# Patient Record
Sex: Male | Born: 1980 | Race: Black or African American | Hispanic: No | Marital: Single | Smoking: Never smoker
Health system: Southern US, Community
[De-identification: ages and names within clinical notes are randomized; demographics above are authoritative.]

## PROBLEM LIST (undated history)

## (undated) DIAGNOSIS — N200 Calculus of kidney: Secondary | ICD-10-CM

## (undated) HISTORY — PX: LITHOTRIPSY: SUR834

---

## 2000-10-22 ENCOUNTER — Emergency Department (HOSPITAL_COMMUNITY): Admission: EM | Admit: 2000-10-22 | Discharge: 2000-10-22 | Payer: Self-pay | Admitting: Emergency Medicine

## 2000-12-21 ENCOUNTER — Emergency Department (HOSPITAL_COMMUNITY): Admission: EM | Admit: 2000-12-21 | Discharge: 2000-12-21 | Payer: Self-pay | Admitting: Emergency Medicine

## 2001-01-17 ENCOUNTER — Emergency Department (HOSPITAL_COMMUNITY): Admission: EM | Admit: 2001-01-17 | Discharge: 2001-01-17 | Payer: Self-pay | Admitting: Internal Medicine

## 2002-12-03 ENCOUNTER — Emergency Department (HOSPITAL_COMMUNITY): Admission: EM | Admit: 2002-12-03 | Discharge: 2002-12-03 | Payer: Self-pay | Admitting: Emergency Medicine

## 2002-12-03 ENCOUNTER — Encounter: Payer: Self-pay | Admitting: Emergency Medicine

## 2003-05-08 ENCOUNTER — Emergency Department (HOSPITAL_COMMUNITY): Admission: EM | Admit: 2003-05-08 | Discharge: 2003-05-08 | Payer: Self-pay | Admitting: Emergency Medicine

## 2004-05-13 ENCOUNTER — Emergency Department (HOSPITAL_COMMUNITY): Admission: EM | Admit: 2004-05-13 | Discharge: 2004-05-13 | Payer: Self-pay | Admitting: Emergency Medicine

## 2006-01-22 ENCOUNTER — Emergency Department (HOSPITAL_COMMUNITY): Admission: EM | Admit: 2006-01-22 | Discharge: 2006-01-22 | Payer: Self-pay | Admitting: Family Medicine

## 2007-01-13 ENCOUNTER — Emergency Department (HOSPITAL_COMMUNITY): Admission: EM | Admit: 2007-01-13 | Discharge: 2007-01-13 | Payer: Self-pay | Admitting: Family Medicine

## 2008-09-29 ENCOUNTER — Emergency Department (HOSPITAL_COMMUNITY): Admission: EM | Admit: 2008-09-29 | Discharge: 2008-09-29 | Payer: Self-pay | Admitting: Emergency Medicine

## 2011-03-20 ENCOUNTER — Emergency Department (HOSPITAL_COMMUNITY)
Admission: EM | Admit: 2011-03-20 | Discharge: 2011-03-20 | Disposition: A | Discharge status: Home / Self Care | Payer: Medicaid Other | Attending: Emergency Medicine | Admitting: Emergency Medicine

## 2011-03-20 DIAGNOSIS — K089 Disorder of teeth and supporting structures, unspecified: Secondary | ICD-10-CM | POA: Insufficient documentation

## 2013-04-22 ENCOUNTER — Emergency Department: Payer: Self-pay | Admitting: Emergency Medicine

## 2013-04-22 LAB — CBC
HCT: 45.2 % (ref 40.0–52.0)
HGB: 15.7 g/dL (ref 13.0–18.0)
MCH: 31.2 pg (ref 26.0–34.0)
MCHC: 34.8 g/dL (ref 32.0–36.0)
MCV: 90 fL (ref 80–100)
Platelet: 168 10*3/uL (ref 150–440)
RBC: 5.03 10*6/uL (ref 4.40–5.90)
RDW: 13.7 % (ref 11.5–14.5)
WBC: 9.2 10*3/uL (ref 3.8–10.6)

## 2013-04-22 LAB — COMPREHENSIVE METABOLIC PANEL
Albumin: 4.1 g/dL (ref 3.4–5.0)
Alkaline Phosphatase: 64 U/L (ref 50–136)
Anion Gap: 3 — ABNORMAL LOW (ref 7–16)
BUN: 13 mg/dL (ref 7–18)
Bilirubin,Total: 0.4 mg/dL (ref 0.2–1.0)
Calcium, Total: 9.1 mg/dL (ref 8.5–10.1)
Chloride: 106 mmol/L (ref 98–107)
Co2: 29 mmol/L (ref 21–32)
Creatinine: 0.99 mg/dL (ref 0.60–1.30)
EGFR (African American): >60
EGFR (Non-African Amer.): >60
Glucose: 93 mg/dL (ref 65–99)
Osmolality: 275 (ref 275–301)
Potassium: 4 mmol/L (ref 3.5–5.1)
SGOT(AST): 26 U/L (ref 15–37)
SGPT (ALT): 43 U/L (ref 12–78)
Sodium: 138 mmol/L (ref 136–145)
Total Protein: 7 g/dL (ref 6.4–8.2)

## 2013-04-22 LAB — URINALYSIS, COMPLETE
Bacteria: NONE SEEN
Bilirubin,UR: NEGATIVE
Blood: NEGATIVE
Glucose,UR: NEGATIVE mg/dL (ref 0–75)
Ketone: NEGATIVE
Leukocyte Esterase: NEGATIVE
Nitrite: NEGATIVE
Ph: 8 (ref 4.5–8.0)
Protein: NEGATIVE
RBC,UR: NONE SEEN /HPF (ref 0–5)
Specific Gravity: 1.01 (ref 1.003–1.030)
Squamous Epithelial: NONE SEEN
WBC UR: NONE SEEN /HPF (ref 0–5)

## 2014-05-11 ENCOUNTER — Emergency Department: Payer: Self-pay | Admitting: Emergency Medicine

## 2015-12-11 ENCOUNTER — Emergency Department: Payer: Self-pay

## 2015-12-11 ENCOUNTER — Emergency Department
Admission: EM | Admit: 2015-12-11 | Discharge: 2015-12-11 | Disposition: A | Discharge status: Home / Self Care | Payer: Self-pay | Attending: Emergency Medicine | Admitting: Emergency Medicine

## 2015-12-11 ENCOUNTER — Encounter: Payer: Self-pay | Admitting: Emergency Medicine

## 2015-12-11 DIAGNOSIS — N23 Unspecified renal colic: Secondary | ICD-10-CM | POA: Insufficient documentation

## 2015-12-11 HISTORY — DX: Calculus of kidney: N20.0

## 2015-12-11 LAB — COMPREHENSIVE METABOLIC PANEL
ALK PHOS: 39 U/L (ref 38–126)
ALT: 41 U/L (ref 17–63)
ANION GAP: 6 (ref 5–15)
AST: 40 U/L (ref 15–41)
Albumin: 4.7 g/dL (ref 3.5–5.0)
BILIRUBIN TOTAL: 1.3 mg/dL — AB (ref 0.3–1.2)
BUN: 13 mg/dL (ref 6–20)
CALCIUM: 9 mg/dL (ref 8.9–10.3)
CO2: 22 mmol/L (ref 22–32)
Chloride: 109 mmol/L (ref 101–111)
Creatinine, Ser: 0.97 mg/dL (ref 0.61–1.24)
GFR calc non Af Amer: >60 mL/min (ref >60)
Glucose, Bld: 125 mg/dL — ABNORMAL HIGH (ref 65–99)
Potassium: 3.7 mmol/L (ref 3.5–5.1)
Sodium: 137 mmol/L (ref 135–145)
TOTAL PROTEIN: 7 g/dL (ref 6.5–8.1)

## 2015-12-11 LAB — CBC WITH DIFFERENTIAL/PLATELET
Basophils Absolute: 0.1 10*3/uL (ref 0–0.1)
Basophils Relative: 1 %
Eosinophils Absolute: 0.3 10*3/uL (ref 0–0.7)
Eosinophils Relative: 3 %
HEMATOCRIT: 46.3 % (ref 40.0–52.0)
HEMOGLOBIN: 15.5 g/dL (ref 13.0–18.0)
LYMPHS ABS: 2 10*3/uL (ref 1.0–3.6)
Lymphocytes Relative: 23 %
MCH: 29.9 pg (ref 26.0–34.0)
MCHC: 33.4 g/dL (ref 32.0–36.0)
MCV: 89.5 fL (ref 80.0–100.0)
MONOS PCT: 9 %
Monocytes Absolute: 0.8 10*3/uL (ref 0.2–1.0)
NEUTROS ABS: 5.7 10*3/uL (ref 1.4–6.5)
Neutrophils Relative %: 64 %
Platelets: 203 10*3/uL (ref 150–440)
RBC: 5.17 MIL/uL (ref 4.40–5.90)
RDW: 14 % (ref 11.5–14.5)
WBC: 8.9 10*3/uL (ref 3.8–10.6)

## 2015-12-11 LAB — LIPASE, BLOOD: Lipase: 36 U/L (ref 11–51)

## 2015-12-11 MED ORDER — TAMSULOSIN HCL 0.4 MG PO CAPS: 0.4000 mg | ORAL_CAPSULE | Freq: Every day | ORAL | Status: AC

## 2015-12-11 MED ORDER — HYDROMORPHONE HCL 1 MG/ML IJ SOLN
INTRAMUSCULAR | Status: AC
  Administered 2015-12-11: 1 mg
  Filled 2015-12-11: qty 1

## 2015-12-11 MED ORDER — KETOROLAC TROMETHAMINE 30 MG/ML IJ SOLN
INTRAMUSCULAR | Status: AC
  Filled 2015-12-11: qty 1

## 2015-12-11 MED ORDER — KETOROLAC TROMETHAMINE 30 MG/ML IJ SOLN
30.0000 mg | Freq: Once | INTRAMUSCULAR | Status: AC
  Administered 2015-12-11: 30 mg via INTRAVENOUS

## 2015-12-11 MED ORDER — SODIUM CHLORIDE 0.9 % IV SOLN
1000.0000 mL | Freq: Once | INTRAVENOUS | Status: AC
  Administered 2015-12-11: 1000 mL via INTRAVENOUS

## 2015-12-11 MED ORDER — ONDANSETRON HCL 4 MG/2ML IJ SOLN
INTRAMUSCULAR | Status: AC
  Filled 2015-12-11: qty 2

## 2015-12-11 MED ORDER — ONDANSETRON HCL 4 MG/2ML IJ SOLN
4.0000 mg | Freq: Once | INTRAMUSCULAR | Status: AC
  Administered 2015-12-11: 4 mg via INTRAVENOUS

## 2015-12-11 MED ORDER — HYDROMORPHONE HCL 1 MG/ML IJ SOLN
1.0000 mg | Freq: Once | INTRAMUSCULAR | Status: AC
  Administered 2015-12-11: 1 mg via INTRAVENOUS

## 2015-12-11 MED ORDER — OXYCODONE-ACETAMINOPHEN 7.5-325 MG PO TABS: 1.0000 | ORAL_TABLET | ORAL | Status: AC | PRN

## 2015-12-11 MED ORDER — HYDROMORPHONE HCL 1 MG/ML IJ SOLN
INTRAMUSCULAR | Status: AC
  Filled 2015-12-11: qty 1

## 2015-12-11 MED ORDER — ONDANSETRON HCL 4 MG PO TABS: 4.0000 mg | ORAL_TABLET | Freq: Every day | ORAL | Status: AC | PRN

## 2015-12-11 NOTE — ED Notes (Signed)
Pt woke up with LLQ abd pain this am, denies N,V,D and flank pain, has a hx of kidney stones.

## 2015-12-11 NOTE — Discharge Instructions (Signed)
Kidney Stones °Kidney stones (urolithiasis) are deposits that form inside your kidneys. The intense pain is caused by the stone moving through the urinary tract. When the stone moves, the ureter goes into spasm around the stone. The stone is usually passed in the urine.  °CAUSES  °· A disorder that makes certain neck glands produce too much parathyroid hormone (primary hyperparathyroidism). °· A buildup of uric acid crystals, similar to gout in your joints. °· Narrowing (stricture) of the ureter. °· A kidney obstruction present at birth (congenital obstruction). °· Previous surgery on the kidney or ureters. °· Numerous kidney infections. °SYMPTOMS  °· Feeling sick to your stomach (nauseous). °· Throwing up (vomiting). °· Blood in the urine (hematuria). °· Pain that usually spreads (radiates) to the groin. °· Frequency or urgency of urination. °DIAGNOSIS  °· Taking a history and physical exam. °· Blood or urine tests. °· CT scan. °· Occasionally, an examination of the inside of the urinary bladder (cystoscopy) is performed. °TREATMENT  °· Observation. °· Increasing your fluid intake. °· Extracorporeal shock wave lithotripsy--This is a noninvasive procedure that uses shock waves to break up kidney stones. °· Surgery may be needed if you have severe pain or persistent obstruction. There are various surgical procedures. Most of the procedures are performed with the use of small instruments. Only small incisions are needed to accommodate these instruments, so recovery time is minimized. °The size, location, and chemical composition are all important variables that will determine the proper choice of action for you. Talk to your health care provider to better understand your situation so that you will minimize the risk of injury to yourself and your kidney.  °HOME CARE INSTRUCTIONS  °· Drink enough water and fluids to keep your urine clear or pale yellow. This will help you to pass the stone or stone fragments. °· Strain  all urine through the provided strainer. Keep all particulate matter and stones for your health care provider to see. The stone causing the pain may be as small as a grain of salt. It is very important to use the strainer each and every time you pass your urine. The collection of your stone will allow your health care provider to analyze it and verify that a stone has actually passed. The stone analysis will often identify what you can do to reduce the incidence of recurrences. °· Only take over-the-counter or prescription medicines for pain, discomfort, or fever as directed by your health care provider. °· Keep all follow-up visits as told by your health care provider. This is important. °· Get follow-up X-rays if required. The absence of pain does not always mean that the stone has passed. It may have only stopped moving. If the urine remains completely obstructed, it can cause loss of kidney function or even complete destruction of the kidney. It is your responsibility to make sure X-rays and follow-ups are completed. Ultrasounds of the kidney can show blockages and the status of the kidney. Ultrasounds are not associated with any radiation and can be performed easily in a matter of minutes. °· Make changes to your daily diet as told by your health care provider. You may be told to: °¨ Limit the amount of salt that you eat. °¨ Eat 5 or more servings of fruits and vegetables each day. °¨ Limit the amount of meat, poultry, fish, and eggs that you eat. °· Collect a 24-hour urine sample as told by your health care provider. You may need to collect another urine sample every 6-12   months. °SEEK MEDICAL CARE IF: °· You experience pain that is progressive and unresponsive to any pain medicine you have been prescribed. °SEEK IMMEDIATE MEDICAL CARE IF:  °· Pain cannot be controlled with the prescribed medicine. °· You have a fever or shaking chills. °· The severity or intensity of pain increases over 18 hours and is not  relieved by pain medicine. °· You develop a new onset of abdominal pain. °· You feel faint or pass out. °· You are unable to urinate. °  °This information is not intended to replace advice given to you by your health care provider. Make sure you discuss any questions you have with your health care provider. °  °Document Released: 09/01/2005 Document Revised: 05/23/2015 Document Reviewed: 02/02/2013 °Elsevier Interactive Patient Education ©2016 [employer] Inc. ° °

## 2015-12-11 NOTE — ED Notes (Signed)
Pt  Noted to have low heart rate per monitor, 40bpm , edp at bedside and aware. Pt denies any pain at this time and is asymptomatic to low rate. No acute distress noted.

## 2015-12-11 NOTE — ED Notes (Signed)
Pt trying to find transportation home. Pt uneasy about driving with this Clinical research associatewriter in agreement. edp aware.

## 2015-12-11 NOTE — ED Provider Notes (Signed)
[facility] Emergency Department Provider Note     Time seen: ----------------------------------------- 7:42 AM on 12/11/2015 -----------------------------------------    I have reviewed the triage vital signs and the nursing notes.   HISTORY  Chief Complaint Abdominal Pain    HPI Jose Arnold is a 35 y.o. male who woke up this morning with left lower quadrant pain. Patient describes a sharp, denies nausea vomiting or diarrhea but has broken out into sweats. Reports he had a history of kidney stones in the past but this feels worse. Nothing has helped his symptoms. Currently is 10 out of 10.   Past Medical History  Diagnosis Date  . Kidney stones     There are no active problems to display for this patient.   History reviewed. No pertinent past surgical history.  Allergies Review of patient's allergies indicates no known allergies.  Social History Social History  Substance Use Topics  . Smoking status: Never Smoker   . Smokeless tobacco: None  . Alcohol Use: No    Review of Systems Constitutional: Negative for fever. Eyes: Negative for visual changes. ENT: Negative for sore throat. Cardiovascular: Negative for chest pain. Respiratory: Negative for shortness of breath. Gastrointestinal: Positive for abdominal pain Genitourinary: Negative for dysuria. Musculoskeletal: Negative for back pain. Skin: Negative for rash. Neurological: Negative for headaches, focal weakness or numbness.  10-point ROS otherwise negative.  ____________________________________________   PHYSICAL EXAM:  VITAL SIGNS: ED Triage Vitals  Enc Vitals Group     BP 12/11/15 0723 146/55 mmHg     Pulse Rate 12/11/15 0723 59     Resp 12/11/15 0723 18     Temp 12/11/15 0723 97.5 F (36.4 C)     Temp Source 12/11/15 0723 Oral     SpO2 12/11/15 0723 97 %     Weight 12/11/15 0722 200 lb (90.719 kg)     Height 12/11/15 0722 6\' 2"  (1.88 m)     Head Cir --       Peak Flow --      Pain Score 12/11/15 0722 10     Pain Loc --      Pain Edu? --      Excl. in GC? --     Constitutional: Alert and oriented. Mild to moderate distress Eyes: Conjunctivae are normal. PERRL. Normal extraocular movements. ENT   Head: Normocephalic and atraumatic.   Nose: No congestion/rhinnorhea.   Mouth/Throat: Mucous membranes are moist.   Neck: No stridor. Cardiovascular: Normal rate, regular rhythm. Normal and symmetric distal pulses are present in all extremities. No murmurs, rubs, or gallops. Respiratory: Normal respiratory effort without tachypnea nor retractions. Breath sounds are clear and equal bilaterally. No wheezes/rales/rhonchi. Gastrointestinal: Left flank tenderness, hypoactive bowel sounds. Musculoskeletal: Nontender with normal range of motion in all extremities. No joint effusions.  No lower extremity tenderness nor edema. Neurologic:  Normal speech and language. No gross focal neurologic deficits are appreciated.  Skin:  Skin is warm, dry and intact. No rash noted. Psychiatric: Mood and affect are normal. Speech and behavior are normal. Patient exhibits appropriate insight and judgment. ____________________________________________  ED COURSE:  Pertinent labs & imaging results that were available during my care of the patient were reviewed by me and considered in my medical decision making (see chart for details). Patient is in mild distress from likely renal colic. He'll receive IV fluids, pain meds and antiemetics. ____________________________________________    LABS (pertinent positives/negatives)  Labs Reviewed  COMPREHENSIVE METABOLIC PANEL - Abnormal; Notable for  the following:    Glucose, Bld 125 (*)    Total Bilirubin 1.3 (*)    All other components within normal limits  CBC WITH DIFFERENTIAL/PLATELET  LIPASE, BLOOD  URINALYSIS COMPLETEWITH MICROSCOPIC ([facility] ONLY)    RADIOLOGY Images were viewed by me  CT renal  protocol IMPRESSION: 1 mm calculus distal left ureter near the ureterovesical junction causing moderate hydronephrosis on the left.  Slight nephrocalcinosis bilaterally.  Appendix appears normal. No bowel obstruction. No abscess. There is fat in the left inguinal ring. ____________________________________________  FINAL ASSESSMENT AND PLAN  Renal colic  Plan: Patient with labs and imaging as dictated above. Patient with a distal left ureteral stone. Currently pain is improving. He'll be discharged with Flomax, pain medicine and close outpatient follow-up with his doctor.   Emily Filbert, MD   Emily Filbert, MD 12/11/15 [phone]

## 2016-01-05 DIAGNOSIS — L308 Other specified dermatitis: Secondary | ICD-10-CM | POA: Insufficient documentation

## 2016-01-05 DIAGNOSIS — J029 Acute pharyngitis, unspecified: Secondary | ICD-10-CM | POA: Insufficient documentation

## 2016-01-05 DIAGNOSIS — F1290 Cannabis use, unspecified, uncomplicated: Secondary | ICD-10-CM | POA: Insufficient documentation

## 2016-01-05 NOTE — ED Notes (Signed)
Patient presents from home with complaints of a sore throat for the past 3 days.  Also with a rash that started the same time in the right groin.  Rash is red per patient description that started with some red bumps.  No problems with urination.

## 2016-01-06 ENCOUNTER — Emergency Department
Admission: EM | Admit: 2016-01-06 | Discharge: 2016-01-06 | Disposition: A | Discharge status: Home / Self Care | Payer: Medicaid Other | Attending: Emergency Medicine | Admitting: Emergency Medicine

## 2016-01-06 DIAGNOSIS — B372 Candidiasis of skin and nail: Secondary | ICD-10-CM

## 2016-01-06 DIAGNOSIS — J029 Acute pharyngitis, unspecified: Secondary | ICD-10-CM

## 2016-01-06 LAB — POCT RAPID STREP A: STREPTOCOCCUS, GROUP A SCREEN (DIRECT): NEGATIVE

## 2016-01-06 MED ORDER — DEXAMETHASONE 10 MG/ML FOR PEDIATRIC ORAL USE
10.0000 mg | Freq: Once | INTRAMUSCULAR | Status: AC
  Administered 2016-01-06: 10 mg via ORAL

## 2016-01-06 MED ORDER — FAMOTIDINE IN NACL 20-0.9 MG/50ML-% IV SOLN
INTRAVENOUS | Status: AC
  Filled 2016-01-06: qty 50

## 2016-01-06 MED ORDER — IBUPROFEN 800 MG PO TABS
800.0000 mg | ORAL_TABLET | Freq: Once | ORAL | Status: AC
  Administered 2016-01-06: 800 mg via ORAL
  Filled 2016-01-06: qty 1

## 2016-01-06 MED ORDER — NYSTATIN 100000 UNIT/GM EX CREA: 1.0000 "application " | TOPICAL_CREAM | Freq: Two times a day (BID) | CUTANEOUS | Status: AC

## 2016-01-06 MED ORDER — DEXAMETHASONE SODIUM PHOSPHATE 10 MG/ML IJ SOLN
INTRAMUSCULAR | Status: AC
  Filled 2016-01-06: qty 1

## 2016-01-06 NOTE — ED Provider Notes (Signed)
[facility] Emergency Department Provider Note  ____________________________________________  Time seen: Approximately 0056 AM  I have reviewed the triage vital signs and the nursing notes.   HISTORY  Chief Complaint Sore Throat    HPI Jose Arnold is a 35 y.o. male who comes into the hospital today with a sore throat as well as a bump in his groin. He reports he woke up a few days ago with a sore throat and then had a hair bump that he started messing with. He reports that the next day to hear from double in size in his throat Bothering him. He reports that today it continued to bother him in the bump in his groin continues to get bigger. The patient has been using Chloraseptic drop and a multisymptom cold medicine. The patient tried putting alcohol on the hair bump once but it didn't do anything. The patient reports that his pain is a 7 out of 10 in intensity. He's had no fevers and no sick contacts. The patient is here for evaluation and treatment.She denies any fevers, coughs, runny nose.   Past Medical History  Diagnosis Date  . Kidney stones     There are no active problems to display for this patient.   Past Surgical History  Procedure Laterality Date  . Lithotripsy Right     Current Outpatient Rx  Name  Route  Sig  Dispense  Refill  . nystatin cream (MYCOSTATIN)   Topical   Apply 1 application topically 2 (two) times daily.   30 g   0   . ondansetron (ZOFRAN) 4 MG tablet   Oral   Take 1 tablet (4 mg total) by mouth daily as needed for nausea or vomiting.   20 tablet   1   . oxyCODONE-acetaminophen (PERCOCET) 7.5-325 MG tablet   Oral   Take 1 tablet by mouth every 4 (four) hours as needed for severe pain.   20 tablet   0   . tamsulosin (FLOMAX) 0.4 MG CAPS capsule   Oral   Take 1 capsule (0.4 mg total) by mouth daily after breakfast.   30 capsule   0     Allergies Review of patient's allergies indicates no known  allergies.  History reviewed. No pertinent family history.  Social History Social History  Substance Use Topics  . Smoking status: Never Smoker   . Smokeless tobacco: None  . Alcohol Use: No    Review of Systems Constitutional: No fever/chills Eyes: No visual changes. ENT:  sore throat. Cardiovascular: Denies chest pain. Respiratory: Denies shortness of breath. Gastrointestinal: No abdominal pain.  No nausea, no vomiting.  No diarrhea.  No constipation. Genitourinary: Negative for dysuria. Musculoskeletal: Negative for back pain. Skin: Bump to right groin Neurological: Negative for headaches, focal weakness or numbness.  10-point ROS otherwise negative.  ____________________________________________   PHYSICAL EXAM:  VITAL SIGNS: ED Triage Vitals  Enc Vitals Group     BP 01/05/16 2045 131/87 mmHg     Pulse Rate 01/05/16 2045 63     Resp 01/05/16 2045 18     Temp 01/05/16 2045 97.8 F (36.6 C)     Temp Source 01/05/16 2045 Oral     SpO2 01/05/16 2045 98 %     Weight 01/05/16 2045 200 lb (90.719 kg)     Height 01/05/16 2045  (1.88 m)     Head Cir --      Peak Flow --      Pain  Score 01/05/16 2053 6     Pain Loc --      Pain Edu? --      Excl. in GC? --     Constitutional: Alert and oriented. Well appearing and in no acute distress. Eyes: Conjunctivae are normal. PERRL. EOMI. Head: Atraumatic. Nose: No congestion/rhinnorhea. Mouth/Throat: Mucous membranes are moist. Mildly erythematous. Cardiovascular: Normal rate, regular rhythm. Grossly normal heart sounds.  Good peripheral circulation. Respiratory: Normal respiratory effort.  No retractions. Lungs CTAB. Gastrointestinal: Soft and nontender. No distention. Positive bowel sounds. Genitourinary: Normal external genitalia with no lesions or lymphadenopathy Musculoskeletal: No lower extremity tenderness nor edema.   Neurologic:  Normal speech and language.  Skin:  Redness and moisture to the patient's  right groin area no specific abscess or bump noted. Psychiatric: Mood and affect are normal.   ____________________________________________   LABS (all labs ordered are listed, but only abnormal results are displayed)  Labs Reviewed  POCT RAPID STREP A   ____________________________________________  EKG  none ____________________________________________  RADIOLOGY  none ____________________________________________   PROCEDURES  Procedure(s) performed: None  Critical Care performed: No  ____________________________________________   INITIAL IMPRESSION / ASSESSMENT AND PLAN / ED COURSE  Pertinent labs & imaging results that were available during my care of the patient were reviewed by me and considered in my medical decision making (see chart for details).  This is a 35 year old male who comes into the hospital today with some sore throat as well as some redness to his groin. Looking at the redness does not appear to be an infected hair follicles nor does it appear to be an abscess. The patient has some yeast to his right groin. The patient strep test did return negative and we will send a culture. I will give the patient a dose of ibuprofen as well as some dexamethasone and he will be discharged home. He'll receive a prescription for nystatin to help with his yeast. ____________________________________________   FINAL CLINICAL IMPRESSION(S) / ED DIAGNOSES  Final diagnoses:  Pharyngitis  Candidal dermatitis      Rebecka ApleyAllison P Ceclia Koker, MD 01/06/16 0121

## 2016-01-06 NOTE — Discharge Instructions (Signed)
Pharyngitis Pharyngitis is redness, pain, and swelling (inflammation) of your pharynx.  CAUSES  Pharyngitis is usually caused by infection. Most of the time, these infections are from viruses (viral) and are part of a cold. However, sometimes pharyngitis is caused by bacteria (bacterial). Pharyngitis can also be caused by allergies. Viral pharyngitis may be spread from person to person by coughing, sneezing, and personal items or utensils (cups, forks, spoons, toothbrushes). Bacterial pharyngitis may be spread from person to person by more intimate contact, such as kissing.  SIGNS AND SYMPTOMS  Symptoms of pharyngitis include:   Sore throat.   Tiredness (fatigue).   Low-grade fever.   Headache.  Joint pain and muscle aches.  Skin rashes.  Swollen lymph nodes.  Plaque-like film on throat or tonsils (often seen with bacterial pharyngitis). DIAGNOSIS  Your health care provider will ask you questions about your illness and your symptoms. Your medical history, along with a physical exam, is often all that is needed to diagnose pharyngitis. Sometimes, a rapid strep test is done. Other lab tests may also be done, depending on the suspected cause.  TREATMENT  Viral pharyngitis will usually get better in 3-4 days without the use of medicine. Bacterial pharyngitis is treated with medicines that kill germs (antibiotics).  HOME CARE INSTRUCTIONS   Drink enough water and fluids to keep your urine clear or pale yellow.   Only take over-the-counter or prescription medicines as directed by your health care provider:   If you are prescribed antibiotics, make sure you finish them even if you start to feel better.   Do not take aspirin.   Get lots of rest.   Gargle with 8 oz of salt water ( tsp of salt per 1 qt of water) as often as every 1-2 hours to soothe your throat.   Throat lozenges (if you are not at risk for choking) or sprays may be used to soothe your throat. SEEK MEDICAL  CARE IF:   You have large, tender lumps in your neck.  You have a rash.  You cough up green, yellow-brown, or bloody spit. SEEK IMMEDIATE MEDICAL CARE IF:   Your neck becomes stiff.  You drool or are unable to swallow liquids.  You vomit or are unable to keep medicines or liquids down.  You have severe pain that does not go away with the use of recommended medicines.  You have trouble breathing (not caused by a stuffy nose). MAKE SURE YOU:   Understand these instructions.  Will watch your condition.  Will get help right away if you are not doing well or get worse.   This information is not intended to replace advice given to you by your health care provider. Make sure you discuss any questions you have with your health care provider.   Document Released: 09/01/2005 Document Revised: 06/22/2013 Document Reviewed: 05/09/2013 Elsevier Interactive Patient Education 2016 Elsevier Inc.  Cutaneous Candidiasis Cutaneous candidiasis is a condition in which there is an overgrowth of yeast (candida) on the skin. Yeast normally live on the skin, but in small enough numbers not to cause any symptoms. In certain cases, increased growth of the yeast may cause an actual yeast infection. This kind of infection usually occurs in areas of the skin that are constantly warm and moist, such as the armpits or the groin. Yeast is the most common cause of diaper rash in babies and in people who cannot control their bowel movements (incontinence). CAUSES  The fungus that most often causes cutaneous candidiasis  is Candida albicans. Conditions that can increase the risk of getting a yeast infection of the skin include:  Obesity.  Pregnancy.  Diabetes.  Taking antibiotic medicine.  Taking birth control pills.  Taking steroid medicines.  Thyroid disease.  An iron or zinc deficiency.  Problems with the immune system. SYMPTOMS   Red, swollen area of the skin.  Bumps on the  skin.  Itchiness. DIAGNOSIS  The diagnosis of cutaneous candidiasis is usually based on its appearance. Light scrapings of the skin may also be taken and viewed under a microscope to identify the presence of yeast. TREATMENT  Antifungal creams may be applied to the infected skin. In severe cases, oral medicines may be needed.  HOME CARE INSTRUCTIONS   Keep your skin clean and dry.  Maintain a healthy weight.  If you have diabetes, keep your blood sugar under control. SEEK IMMEDIATE MEDICAL CARE IF:  Your rash continues to spread despite treatment.  You have a fever, chills, or abdominal pain.   This information is not intended to replace advice given to you by your health care provider. Make sure you discuss any questions you have with your health care provider.   Document Released: 05/20/2011 Document Revised: 11/24/2011 Document Reviewed: 03/05/2015 Elsevier Interactive Patient Education [employer] Inc.

## 2016-01-08 ENCOUNTER — Encounter: Payer: Self-pay | Admitting: *Deleted

## 2016-01-08 ENCOUNTER — Emergency Department
Admission: EM | Admit: 2016-01-08 | Discharge: 2016-01-08 | Disposition: A | Discharge status: Home / Self Care | Payer: Medicaid Other | Attending: Emergency Medicine | Admitting: Emergency Medicine

## 2016-01-08 DIAGNOSIS — B356 Tinea cruris: Secondary | ICD-10-CM | POA: Insufficient documentation

## 2016-01-08 DIAGNOSIS — F1290 Cannabis use, unspecified, uncomplicated: Secondary | ICD-10-CM | POA: Insufficient documentation

## 2016-01-08 DIAGNOSIS — Z79899 Other long term (current) drug therapy: Secondary | ICD-10-CM | POA: Insufficient documentation

## 2016-01-08 DIAGNOSIS — R55 Syncope and collapse: Secondary | ICD-10-CM

## 2016-01-08 LAB — BASIC METABOLIC PANEL
Anion gap: 8 (ref 5–15)
BUN: 10 mg/dL (ref 6–20)
CO2: 29 mmol/L (ref 22–32)
CREATININE: 0.9 mg/dL (ref 0.61–1.24)
Calcium: 9.2 mg/dL (ref 8.9–10.3)
Chloride: 101 mmol/L (ref 101–111)
GFR calc Af Amer: >60 mL/min (ref >60)
Glucose, Bld: 108 mg/dL — ABNORMAL HIGH (ref 65–99)
POTASSIUM: 4.2 mmol/L (ref 3.5–5.1)
Sodium: 138 mmol/L (ref 135–145)

## 2016-01-08 LAB — CBC
HEMATOCRIT: 44.1 % (ref 40.0–52.0)
Hemoglobin: 14.9 g/dL (ref 13.0–18.0)
MCH: 30.1 pg (ref 26.0–34.0)
MCHC: 33.7 g/dL (ref 32.0–36.0)
MCV: 89.2 fL (ref 80.0–100.0)
PLATELETS: 215 10*3/uL (ref 150–440)
RBC: 4.94 MIL/uL (ref 4.40–5.90)
RDW: 14 % (ref 11.5–14.5)
WBC: 13.3 10*3/uL — AB (ref 3.8–10.6)

## 2016-01-08 LAB — GLUCOSE, CAPILLARY: Glucose-Capillary: 87 mg/dL (ref 65–99)

## 2016-01-08 MED ORDER — KETOROLAC TROMETHAMINE 30 MG/ML IJ SOLN
15.0000 mg | Freq: Once | INTRAMUSCULAR | Status: AC
  Administered 2016-01-08: 15 mg via INTRAVENOUS
  Filled 2016-01-08: qty 1

## 2016-01-08 MED ORDER — TRAMADOL HCL 50 MG PO TABS: 50.0000 mg | ORAL_TABLET | Freq: Four times a day (QID) | ORAL | Status: DC | PRN

## 2016-01-08 MED ORDER — CEPHALEXIN 500 MG PO CAPS: 500.0000 mg | ORAL_CAPSULE | Freq: Four times a day (QID) | ORAL | Status: AC

## 2016-01-08 MED ORDER — CLOTRIMAZOLE 1 % EX CREA
TOPICAL_CREAM | Freq: Two times a day (BID) | CUTANEOUS | Status: DC
  Administered 2016-01-08: 12:00:00 via TOPICAL
  Filled 2016-01-08: qty 15

## 2016-01-08 NOTE — Discharge Instructions (Signed)
Near-Syncope °Near-syncope (commonly known as near fainting) is sudden weakness, dizziness, or feeling like you might pass out. During an episode of near-syncope, you may also develop pale skin, have tunnel vision, or feel sick to your stomach (nauseous). Near-syncope may occur when getting up after sitting or while standing for a long time. It is caused by a sudden decrease in blood flow to the brain. This decrease can result from various causes or triggers, most of which are not serious. However, because near-syncope can sometimes be a sign of something serious, a medical evaluation is required. The specific cause is often not determined. °HOME CARE INSTRUCTIONS  °Monitor your condition for any changes. The following actions may help to alleviate any discomfort you are experiencing: °· Have someone stay with you until you feel stable. °· Lie down right away and prop your feet up if you start feeling like you might faint. Breathe deeply and steadily. Wait until all the symptoms have passed. Most of these episodes last only a few minutes. You may feel tired for several hours.   °· Drink enough fluids to keep your urine clear or pale yellow.   °· If you are taking blood pressure or heart medicine, get up slowly when seated or lying down. Take several minutes to sit and then stand. This can reduce dizziness. °· Follow up with your health care provider as directed.  °SEEK IMMEDIATE MEDICAL CARE IF:  °· You have a severe headache.   °· You have unusual pain in the chest, abdomen, or back.   °· You are bleeding from the mouth or rectum, or you have black or tarry stool.   °· You have an irregular or very fast heartbeat.   °· You have repeated fainting or have seizure-like jerking during an episode.   °· You faint when sitting or lying down.   °· You have confusion.   °· You have difficulty walking.   °· You have severe weakness.   °· You have vision problems.   °MAKE SURE YOU:  °· Understand these instructions. °· Will  watch your condition. °· Will get help right away if you are not doing well or get worse. °  °This information is not intended to replace advice given to you by your health care provider. Make sure you discuss any questions you have with your health care provider. °  °Document Released: 09/01/2005 Document Revised: 09/06/2013 Document Reviewed: 02/04/2013 °Elsevier Interactive Patient Education ©2016 Elsevier Inc. ° °Please return immediately if condition worsens. Please contact her primary physician or the physician you were given for referral. If you have any specialist physicians involved in her treatment and plan please also contact them. Thank you for using [facility]. ° °

## 2016-01-08 NOTE — ED Provider Notes (Signed)
Time Seen: Approximately 1040  I have reviewed the triage notes  Chief Complaint: Dizziness and Near Syncope   History of Present Illness: Jose Bilbo Arnold is a 35 y.o. male who presents referred here from [facility]. Patient was following up for a rash and was located in his right groin area. Patient was here on the 23rd and started on Mycostatin antifungal cream. And states his rash is gotten worse and is more uncomfortable. He states this periodically itchy. He denies any penile discharge or drainage. He also was seen here for sore throat and had a negative strep test. Patient denies any history of immunocompromised conditions such as HIV. Patient denies any other areas of rash. He states he first noticed it as a "" bump on his groin "". Patient was following up at the clinic when he had an episode where he felt very lightheaded, cold, clammy. He felt like he was going to lose consciousness but did not.   Past Medical History  Diagnosis Date  . Kidney stones     There are no active problems to display for this patient.   Past Surgical History  Procedure Laterality Date  . Lithotripsy Right     Past Surgical History  Procedure Laterality Date  . Lithotripsy Right     Current Outpatient Rx  Name  Route  Sig  Dispense  Refill  . nystatin cream (MYCOSTATIN)   Topical   Apply 1 application topically 2 (two) times daily.   30 g   0   . ondansetron (ZOFRAN) 4 MG tablet   Oral   Take 1 tablet (4 mg total) by mouth daily as needed for nausea or vomiting.   20 tablet   1   . oxyCODONE-acetaminophen (PERCOCET) 7.5-325 MG tablet   Oral   Take 1 tablet by mouth every 4 (four) hours as needed for severe pain.   20 tablet   0   . tamsulosin (FLOMAX) 0.4 MG CAPS capsule   Oral   Take 1 capsule (0.4 mg total) by mouth daily after breakfast.   30 capsule   0     Allergies:  Review of patient's allergies indicates no known allergies.  Family History: History  reviewed. No pertinent family history.  Social History: Social History  Substance Use Topics  . Smoking status: Never Smoker   . Smokeless tobacco: None  . Alcohol Use: No     Review of Systems:   10 point review of systems was performed and was otherwise negative:  Constitutional: No fever Eyes: No visual disturbances ENT: No sore throat, ear pain Cardiac: No chest pain or shortness of breath Respiratory: No shortness of breath, wheezing, or stridor Abdomen: No abdominal pain, no vomiting, No diarrhea Endocrine: No weight loss, No night sweats Extremities: No peripheral edema, cyanosis Skin: Rash mentioned above Neurologic: No focal weakness, trouble with speech or swollowing Urologic: No dysuria, Hematuria, or urinary frequency   Physical Exam:  ED Triage Vitals  Enc Vitals Group     BP 01/08/16 0951 121/66 mmHg     Pulse Rate 01/08/16 0951 56     Resp 01/08/16 0951 18     Temp 01/08/16 0951 97.7 F (36.5 C)     Temp Source 01/08/16 0951 Oral     SpO2 01/08/16 0951 94 %     Weight 01/08/16 0951 200 lb (90.719 kg)     Height 01/08/16 0951  (1.88 m)     Head Cir --  Peak Flow --      Pain Score 01/08/16 0952 0     Pain Loc --      Pain Edu? --      Excl. in GC? --     General: Awake , Alert , and Oriented times 3; GCS 15 Head: Normal cephalic , atraumatic Eyes: Pupils equal , round, reactive to light Nose/Throat: No nasal drainage, patent upper airway without erythema or exudate.  Neck: Supple, Full range of motion, No anterior adenopathy or palpable thyroid masses Lungs: Clear to ascultation without wheezes , rhonchi, or rales Heart: Bradycardic without murmurs , gallops , or rubs Abdomen: Soft, non tender without rebound, guarding , or rigidity; bowel sounds positive and symmetric in all 4 quadrants. No organomegaly .        Extremities: 2 plus symmetric pulses. No edema, clubbing or cyanosis Neurologic: normal ambulation, Motor symmetric without  deficits, sensory intact Skin:Examination of the right groin shows an inflamed area with appears to be some localized trauma. The rash is in appearance of fungal somewhat erythematous and uncomfortable as it sits in the crease of the right groin Labs:   All laboratory work was reviewed including any pertinent negatives or positives listed below:  Labs Reviewed  BASIC METABOLIC PANEL - Abnormal; Notable for the following:    Glucose, Bld 108 (*)    All other components within normal limits  CBC - Abnormal; Notable for the following:    WBC 13.3 (*)    All other components within normal limits  GLUCOSE, CAPILLARY  CBG MONITORING, ED    EKG: ED ECG REPORT I, Jennye MoccasinBrian S Codi Folkerts, the attending physician, personally viewed and interpreted this ECG.  Date: 01/08/2016 EKG Time: 0957 Rate: 45 Rhythm: normal sinus rhythm QRS Axis: normal Intervals: normal ST/T Wave abnormalities: normal Conduction Disturbances: none Narrative Interpretation: unremarkable  No acute ischemic changes   ED Course: * Patient's stay here was uneventful he is continued on a cardiac monitor. I felt he most likely had vasovagal near syncopal episodes especially with his presentation with bradycardia. He did not actually lose consciousness and his EKG otherwise appears to be within normal limits and I felt was unlikely to be a cardiac arrhythmia   Assessment:  Near syncope Tinea cruris      Plan:  Outpatient management Patient was prescribed Keflex for what appears to be some surrounding erythema and also given a prescription for Ultram for pain. Skin advised drink plenty of fluids and apply the Lotrimin which was given to him here in emergency department at least twice a day. Patient was advised to return immediately if condition worsens. Patient was advised to follow up with their primary care physician or other specialized physicians involved in their outpatient care. The patient and/or family member/power  of attorney had laboratory results reviewed at the bedside. All questions and concerns were addressed and appropriate discharge instructions were distributed by the nursing staff.            Jennye MoccasinBrian S Tea Collums, MD 01/08/16 1239

## 2016-01-08 NOTE — ED Notes (Signed)
[facility] follow up and had a near syncope episode, pt arrives pale in color, awake

## 2016-01-08 NOTE — ED Notes (Signed)
Pharmacy called regarding lotrimin, will send to ED momentarily.

## 2017-04-20 IMAGING — CT CT RENAL STONE PROTOCOL
1 of 3 series · 14 of 32 positions shown, 18 images · non-contrast
Comparison: August 09, 2009

CLINICAL DATA: Left flank/ left lower quadrant pain for 1 day

EXAM:
CT ABDOMEN AND PELVIS WITHOUT CONTRAST
TECHNIQUE: Multidetector CT imaging of the abdomen and pelvis was performed
following the standard protocol without oral or intravenous contrast
material administration.

[Series 4: stone standard full · axial · 0.75mm/px · z∈[-1183,-708]mm · 14 of 105 slices shown, 18 images]
[im 5/105  soft-tissue]
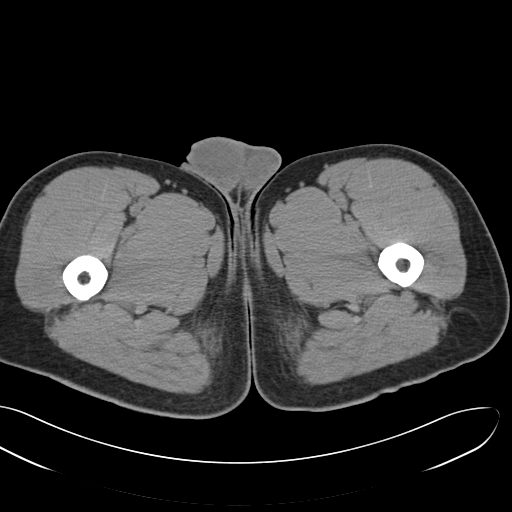
[im 5/105  bone]
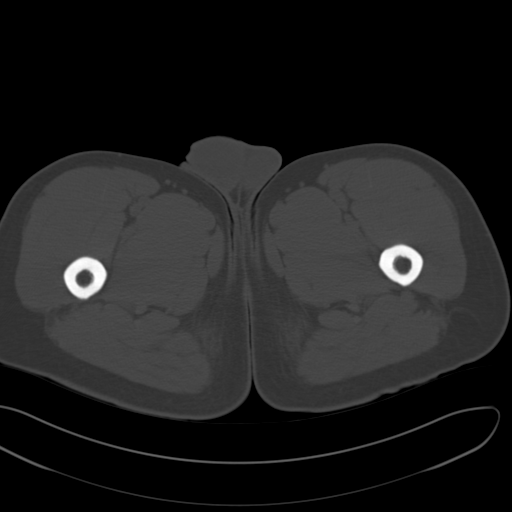
[im 15/105  soft-tissue]
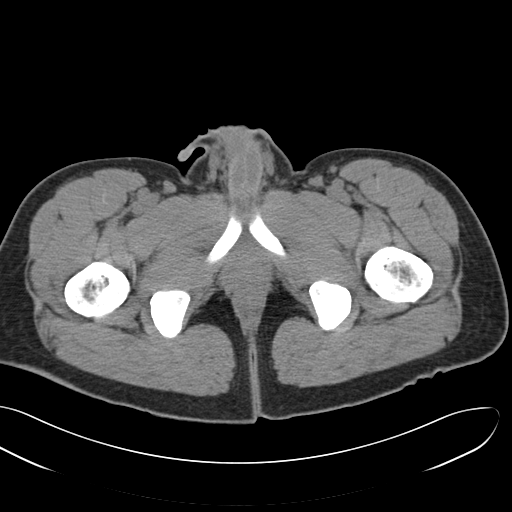
[im 25/105  soft-tissue]
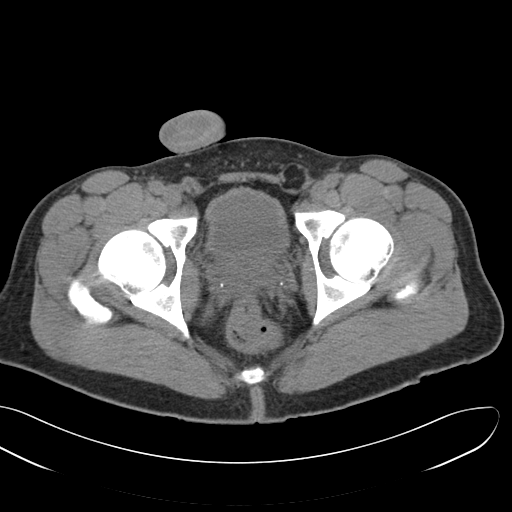
[im 30/105  soft-tissue]
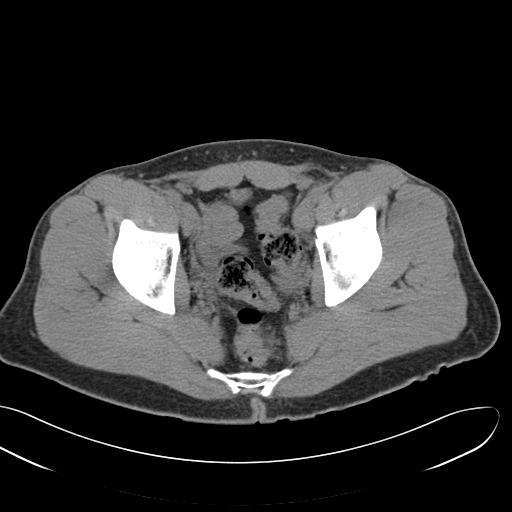
[im 40/105  soft-tissue]
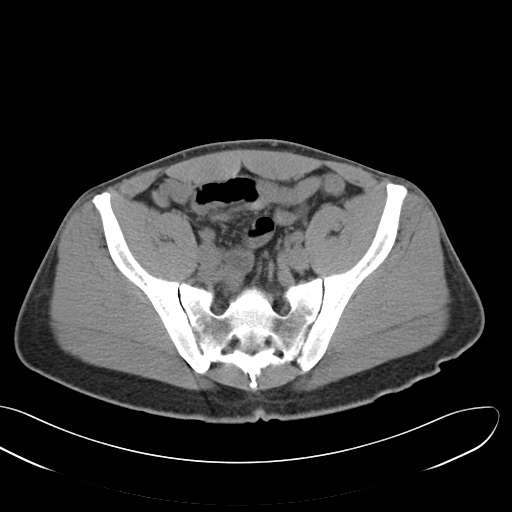
[im 50/105  soft-tissue]
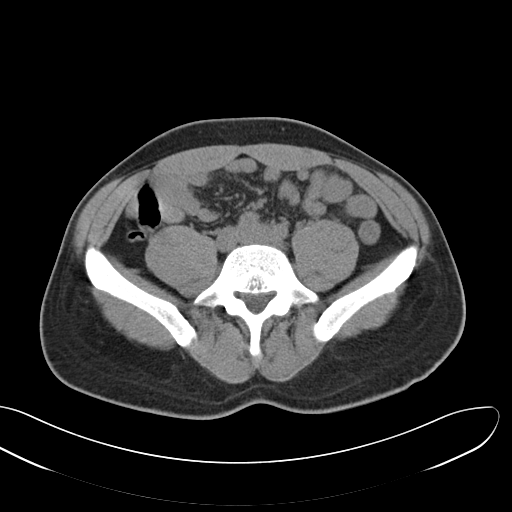
[im 55/105  soft-tissue]
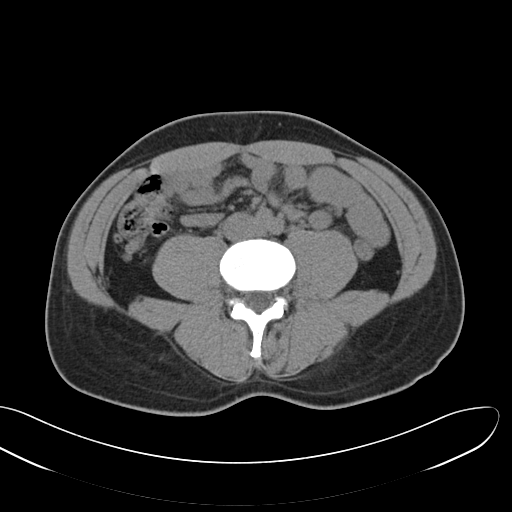
[im 65/105  soft-tissue]
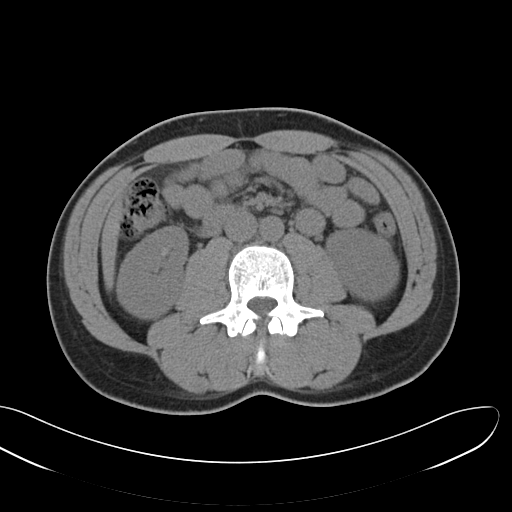
[im 75/105  soft-tissue]
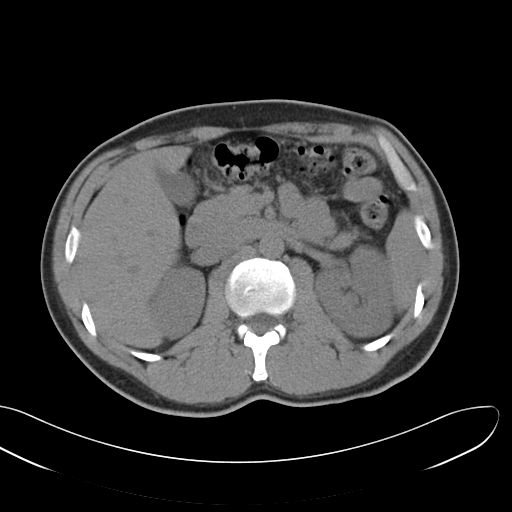
[im 75/105  bone]
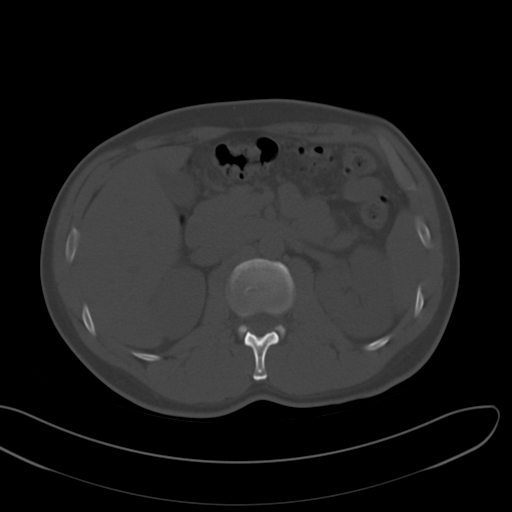
[im 80/105  soft-tissue]
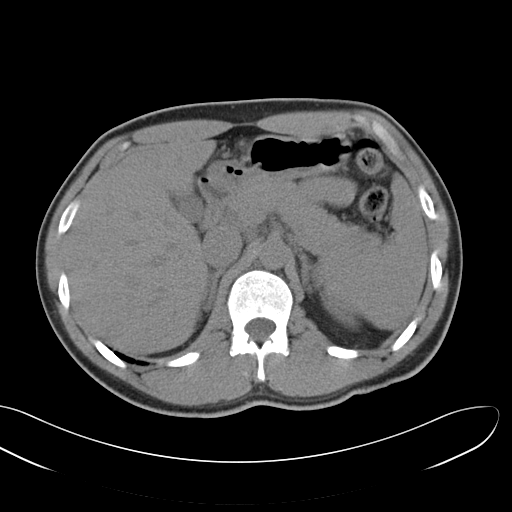
[im 85/105  lung]
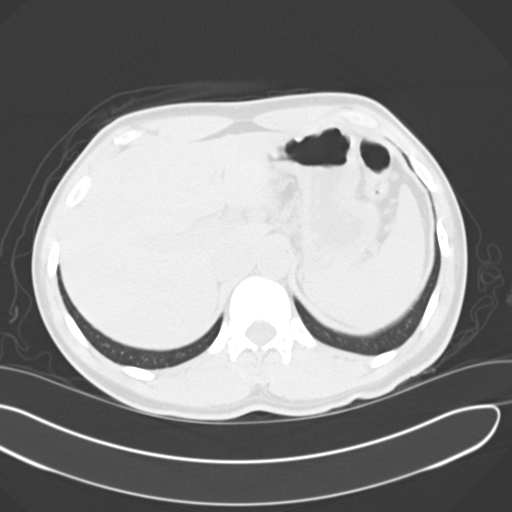
[im 90/105  soft-tissue]
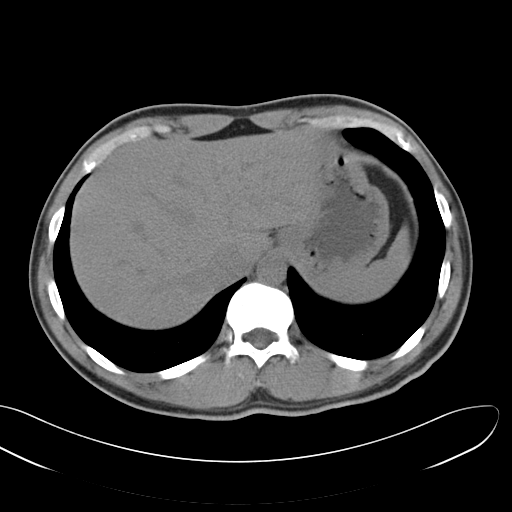
[im 90/105  lung]
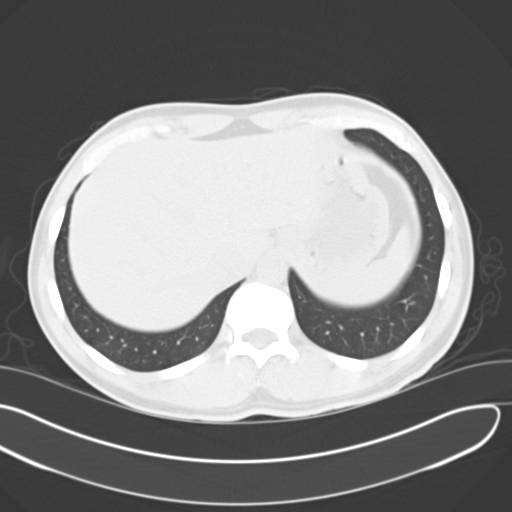
[im 95/105  lung]
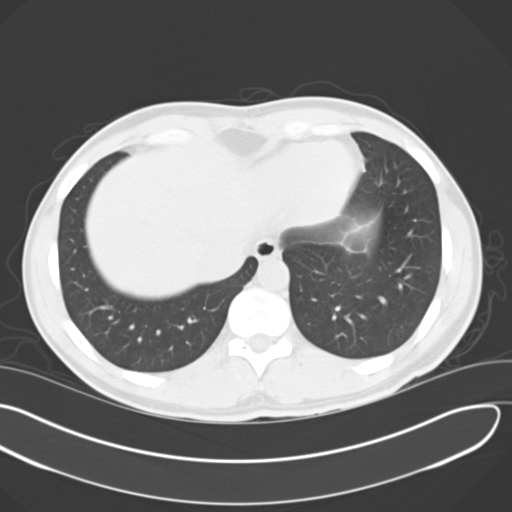
[im 100/105  soft-tissue]
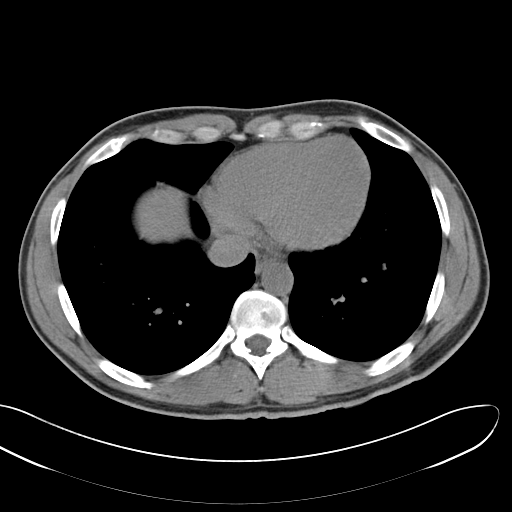
[im 100/105  lung]
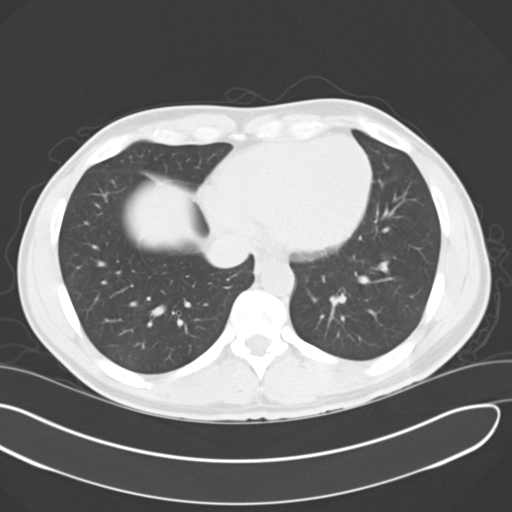

[14 of 32 positions shown; findings below may reference images not displayed]

FINDINGS: Lower chest:  Lung bases are clear.

Hepatobiliary: No focal liver lesions are identified on this
noncontrast enhanced study. Gallbladder wall is not appreciably
thickened. There is no biliary duct dilatation.

Pancreas: There is no pancreatic mass or inflammatory focus.

Spleen: No splenic lesions are identified.

Adrenals/Urinary Tract: Adrenals appear normal bilaterally. There is
slight nephrocalcinosis bilaterally. There is no renal mass on
either side. There is no appreciable hydronephrosis on the right.
There is moderate hydronephrosis on the left. There is known
intrarenal calculus on either side. There is no ureteral calculus on
the right. On the left, there is a 1 mm calculus in the distal left
ureter near the ureterovesical junction. No other ureteral calculi
are identified. The urinary bladder is midline with wall thickness
within normal limits.

Stomach/Bowel: There is no bowel wall or mesenteric thickening. No
bowel obstruction. No free air or portal venous air.

Vascular/Lymphatic: There is no demonstrable abdominal aortic
aneurysm. No vascular lesions are evident on this noncontrast
enhanced study. Several pelvic phleboliths are noted. There is no
appreciable adenopathy in the abdomen or pelvis.

Reproductive: Prostate and seminal vesicles appear normal in size
and contour. No pelvic mass or pelvic fluid collection.

Other: Appendix appears normal. No abscess or ascites is apparent in
the abdomen or pelvis. There is fat in the left inguinal ring.

Musculoskeletal: There are no blastic or lytic bone lesions. There
is no intramuscular or abdominal wall lesion.
IMPRESSION: 1 mm calculus distal left ureter near the ureterovesical junction
causing moderate hydronephrosis on the left.

Slight nephrocalcinosis bilaterally.

Appendix appears normal. No bowel obstruction. No abscess. There is
fat in the left inguinal ring.

## 2017-06-24 ENCOUNTER — Encounter: Payer: Self-pay | Admitting: *Deleted

## 2017-06-24 DIAGNOSIS — L259 Unspecified contact dermatitis, unspecified cause: Secondary | ICD-10-CM | POA: Insufficient documentation

## 2017-06-24 NOTE — ED Triage Notes (Signed)
Pt has rash on arms, hips, axilla and groin area.  Sx for 3 weeks.

## 2017-06-25 ENCOUNTER — Emergency Department
Admission: EM | Admit: 2017-06-25 | Discharge: 2017-06-25 | Disposition: A | Discharge status: Home / Self Care | Payer: Self-pay | Attending: Emergency Medicine | Admitting: Emergency Medicine

## 2017-06-25 DIAGNOSIS — L309 Dermatitis, unspecified: Secondary | ICD-10-CM

## 2017-06-25 MED ORDER — TRIAMCINOLONE ACETONIDE 0.5 % EX OINT: 1.0000 "application " | TOPICAL_OINTMENT | Freq: Two times a day (BID) | CUTANEOUS | 0 refills | Status: AC

## 2017-06-25 NOTE — Discharge Instructions (Signed)
PLease follow up with dermatology for further evaluation of your dermatitis

## 2017-06-25 NOTE — ED Notes (Signed)
Reviewed d/c instructions, follow-up care, prescription with patient. Pt verbalized understanding.  

## 2017-06-25 NOTE — ED Provider Notes (Signed)
[facility] Emergency Department Provider Note   ____________________________________________   First MD Initiated Contact with Patient 06/25/17 [phone]     (approximate)  I have reviewed the triage vital signs and the nursing notes.   HISTORY  Chief Complaint Rash    HPI Jose Arnold is a 36 y.o. male who comes into the hospital today with some rashes on multiple parts of his body. He states that they have been bothering him. He tried a few creams but reports that it won't clear up. The rash has been there for over a month. The itching and burning. The patient states that he's never had it before. He denies any exposures. He has not seen for this rash. The patient states that he has it on his left hip as well as in his groin. He also has a in his perianal area and a fewspots on his arms. Some of the areas are scabbed because he has been itching significantly. The patient decided to come in tonight to get it checked out. He states that he initially thought it was jock itch so he tried some Lotrimin spray. He states that he also tried something for itching over-the-counter.   Past Medical History:  Diagnosis Date  . Kidney stones     There are no active problems to display for this patient.   Past Surgical History:  Procedure Laterality Date  . LITHOTRIPSY Right     Prior to Admission medications   Medication Sig Start Date End Date Taking? Authorizing Provider  ondansetron (ZOFRAN) 4 MG tablet Take 1 tablet (4 mg total) by mouth daily as needed for nausea or vomiting. 12/11/15   Emily Filbert, MD  tamsulosin (FLOMAX) 0.4 MG CAPS capsule Take 1 capsule (0.4 mg total) by mouth daily after breakfast. 12/11/15   Emily Filbert, MD  traMADol (ULTRAM) 50 MG tablet Take 1 tablet (50 mg total) by mouth every 6 (six) hours as needed. 01/08/16   Jennye Moccasin, MD  triamcinolone ointment (KENALOG) 0.5 % Apply 1 application topically 2 (two)  times daily. 06/25/17   Rebecka Apley, MD    Allergies Patient has no known allergies.  No family history on file.  Social History Social History  Substance Use Topics  . Smoking status: Never Smoker  . Smokeless tobacco: Never Used  . Alcohol use No    Review of Systems  Constitutional: No fever/chills Eyes: No visual changes. ENT: No sore throat. Cardiovascular: Denies chest pain. Respiratory: Denies shortness of breath. Gastrointestinal: No abdominal pain.  No nausea, no vomiting.  No diarrhea.  No constipation. Genitourinary: Negative for dysuria. Musculoskeletal: Negative for back pain. Skin:  Rash, itching Neurological: Negative for headaches, focal weakness or numbness.   ____________________________________________   PHYSICAL EXAM:  VITAL SIGNS: ED Triage Vitals  Enc Vitals Group     BP 06/24/17 2251 118/68     Pulse Rate 06/24/17 2251 66     Resp 06/24/17 2251 16     Temp 06/24/17 2251 97.9 F (36.6 C)     Temp Source 06/24/17 2251 Oral     SpO2 06/24/17 2251 98 %     Weight 06/24/17 2252 200 lb (90.7 kg)     Height 06/24/17 2252  (1.88 m)     Head Circumference --      Peak Flow --      Pain Score 06/24/17 2254 6     Pain Loc --  Pain Edu? --      Excl. in GC? --     Constitutional: Alert and oriented. Well appearing and in mild distress. Eyes: Conjunctivae are normal. PERRL. EOMI. Head: Atraumatic. Nose: No congestion/rhinnorhea. Mouth/Throat: Mucous membranes are moist.  Oropharynx non-erythematous. Cardiovascular: Normal rate, regular rhythm. Grossly normal heart sounds.  Good peripheral circulation. Respiratory: Normal respiratory effort.  No retractions. Lungs CTAB. Gastrointestinal: Soft and nontender. No distention. Positive bowel sounds Musculoskeletal: No lower extremity tenderness nor edema.   Neurologic:  Normal speech and language.  Skin:  Skin is warm, dry and intact. Mildly excoriated raised rash to the patient's  left hip. Some striae noted to the patient's left groin. More maculopapular excoriated rash noted in the patient's perianal area. Scaly rash noted to the patient's left forearm. Psychiatric: Mood and affect are normal.  ____________________________________________   LABS (all labs ordered are listed, but only abnormal results are displayed)  Labs Reviewed - No data to display ____________________________________________  EKG  none ____________________________________________  RADIOLOGY  No results found.  ____________________________________________   PROCEDURES  Procedure(s) performed: None  Procedures  Critical Care performed: No  ____________________________________________   INITIAL IMPRESSION / ASSESSMENT AND PLAN / ED COURSE  As part of my medical decision making, I reviewed the following data within the electronic MEDICAL RECORD NUMBER Notes from prior ED visits   This is a 36 year old male who comes into the hospital today with some rashes that he's noticed over the past month.  My differential diagnosis includes dermatitis, scabies, fungal rash.  The patient states that he has tried some Lotrimin spray and has not helped. I will give the patient a prescription for steroid cream as it is an excoriated and raised rash. The patient should follow-up though with dermatology for further evaluation of this rash. He'll be discharged home.      ____________________________________________   FINAL CLINICAL IMPRESSION(S) / ED DIAGNOSES  Final diagnoses:  Dermatitis      NEW MEDICATIONS STARTED DURING THIS VISIT:  New Prescriptions   TRIAMCINOLONE OINTMENT (KENALOG) 0.5 %    Apply 1 application topically 2 (two) times daily.     Note:  This document was prepared using Dragon voice recognition software and may include unintentional dictation errors.    Rebecka Apley, MD 06/25/17 [phone]

## 2017-08-29 ENCOUNTER — Emergency Department
Admission: EM | Admit: 2017-08-29 | Discharge: 2017-08-29 | Disposition: A | Discharge status: Home / Self Care | Payer: Self-pay | Attending: Emergency Medicine | Admitting: Emergency Medicine

## 2017-08-29 ENCOUNTER — Encounter: Payer: Self-pay | Admitting: Emergency Medicine

## 2017-08-29 ENCOUNTER — Emergency Department: Payer: Self-pay

## 2017-08-29 DIAGNOSIS — Y998 Other external cause status: Secondary | ICD-10-CM | POA: Insufficient documentation

## 2017-08-29 DIAGNOSIS — Y929 Unspecified place or not applicable: Secondary | ICD-10-CM | POA: Insufficient documentation

## 2017-08-29 DIAGNOSIS — S60221A Contusion of right hand, initial encounter: Secondary | ICD-10-CM | POA: Insufficient documentation

## 2017-08-29 DIAGNOSIS — Z79899 Other long term (current) drug therapy: Secondary | ICD-10-CM | POA: Insufficient documentation

## 2017-08-29 DIAGNOSIS — W228XXA Striking against or struck by other objects, initial encounter: Secondary | ICD-10-CM | POA: Insufficient documentation

## 2017-08-29 DIAGNOSIS — Y9389 Activity, other specified: Secondary | ICD-10-CM | POA: Insufficient documentation

## 2017-08-29 NOTE — Discharge Instructions (Signed)
Follow-up with your regular doctor if you are not better in 5-7 days, apply ice to the hand, keep it elevated as needed for pain, you may take Tylenol or ibuprofen as needed for pain

## 2017-08-29 NOTE — ED Notes (Signed)

## 2017-08-29 NOTE — ED Triage Notes (Signed)
Patient presents to the ED with right hand pain x 2 weeks, worse on Sunday.  Patient states he fell playing basketball 2 weeks ago and originally injured his hand but then on Sunday when he was "breaking up a piece of ice" pain became much worse.  Patient states it is difficult to use his right hand.

## 2017-08-29 NOTE — ED Provider Notes (Signed)
[facility] Emergency Department Provider Note  ____________________________________________   First MD Initiated Contact with Patient 08/29/17 1758     (approximate)  I have reviewed the triage vital signs and the nursing notes.   HISTORY  Chief Complaint Hand Pain    HPI Jose Harless NakayamaJernard Arnold is a 36 y.o. male complains of right hand pain, states he did hit it a month ago and had a little pain but then this weekend he was using the side of his hand to hit ice out of the tire well and his hand has been hurting more since then, he denies any loss of motion, denies any other injuries, denies numbness or tingling  Past Medical History:  Diagnosis Date  . Kidney stones     There are no active problems to display for this patient.   Past Surgical History:  Procedure Laterality Date  . LITHOTRIPSY Right     Prior to Admission medications   Medication Sig Start Date End Date Taking? Authorizing Provider  ondansetron (ZOFRAN) 4 MG tablet Take 1 tablet (4 mg total) by mouth daily as needed for nausea or vomiting. 12/11/15   Emily FilbertWilliams, Jonathan E, MD  tamsulosin [facility](FLOMAX) 0.4 MG CAPS capsule Take 1 capsule (0.4 mg total) by mouth daily after breakfast. 12/11/15   Emily FilbertWilliams, Jonathan E, MD  triamcinolone ointment (KENALOG) 0.5 % Apply 1 application topically 2 (two) times daily. 06/25/17   Rebecka ApleyWebster, Allison P, MD    Allergies Patient has no known allergies.  No family history on file.  Social History Social History   Tobacco Use  . Smoking status: Never Smoker  . Smokeless tobacco: Never Used  Substance Use Topics  . Alcohol use: No  . Drug use: Yes    Types: Marijuana    Review of Systems  Constitutional: No fever/chills Eyes: No visual changes. ENT: No sore throat. Respiratory: Denies cough Genitourinary: Negative for dysuria. Musculoskeletal: Negative for back pain.  Positive for right hand pain Skin: Negative for  rash.    ____________________________________________   PHYSICAL EXAM:  VITAL SIGNS: ED Triage Vitals  Enc Vitals Group     BP 08/29/17 1702 (!) 141/80     Pulse Rate 08/29/17 1702 77     Resp 08/29/17 1702 16     Temp 08/29/17 1702 97.7 F (36.5 C)     Temp Source 08/29/17 1702 Oral     SpO2 08/29/17 1702 95 %     Weight 08/29/17 1701 200 lb (90.7 kg)     Height 08/29/17 1701 6\' 2"  (1.88 m)     Head Circumference --      Peak Flow --      Pain Score 08/29/17 1700 5     Pain Loc --      Pain Edu? --      Excl. in GC? --     Constitutional: Alert and oriented. Well appearing and in no acute distress. Eyes: Conjunctivae are normal.  Head: Atraumatic. Nose: No congestion/rhinnorhea. Mouth/Throat: Mucous membranes are moist.   Cardiovascular: Normal rate, regular rhythm.  Heart sounds are normal Respiratory: Normal respiratory effort.  No retractions, lungs are clear to auscultation GU: deferred Musculoskeletal: FROM all extremities, warm and well perfused right hand is tender along the fifth metacarpal,, no swelling is noted, the wrist is not tender, he is able to move all fingers, neurovascular is intact Neurologic:  Normal speech and language.  Skin:  Skin is warm, dry and intact. No rash noted. Psychiatric:  Mood and affect are normal. Speech and behavior are normal.  ____________________________________________   LABS (all labs ordered are listed, but only abnormal results are displayed)  Labs Reviewed - No data to display ____________________________________________   ____________________________________________  RADIOLOGY  X-ray of the right hand is negative  ____________________________________________   PROCEDURES  Procedure(s) performed: No      ____________________________________________   INITIAL IMPRESSION / ASSESSMENT AND PLAN / ED COURSE  Pertinent labs & imaging results that were available during my care of the patient were reviewed  by me and considered in my medical decision making (see chart for details).  Patient is 36 year old male complaining of right hand pain, the x-ray of the right hand is negative, diagnosis is contusion to the right hand, he is instructed to take over-the-counter Tylenol or ibuprofen as needed for pain, he is to apply ice as needed, and Ace wrap was applied to give him extra support for a few days, he was discharged in stable condition, he stated he understood and will comply with the treatment plan      ____________________________________________   FINAL CLINICAL IMPRESSION(S) / ED DIAGNOSES  Final diagnoses:  Contusion of right hand, initial encounter      NEW MEDICATIONS STARTED DURING THIS VISIT:  This SmartLink is deprecated. Use AVSMEDLIST instead to display the medication list for a patient.   Note:  This document was prepared using Dragon voice recognition software and may include unintentional dictation errors.    Faythe GheeFisher, Dwan Hemmelgarn W, PA-C 08/29/17 1911    Jeanmarie PlantMcShane, James A, MD 08/29/17 [phone]

## 2017-08-29 NOTE — ED Notes (Signed)
Pt states he developed pain on the medial aspect of his right hand on Sunday after using the outside of the same hand getting ice out of the wheel well of his vehicle. Pt states he didn't come sooner because he had to work. Pt reports 5/10 pain while just resting it on the bed but shoots up to an 8 or a 9 when moving it. Pt reports inability to grasp items.

## 2017-11-15 ENCOUNTER — Emergency Department
Admission: EM | Admit: 2017-11-15 | Discharge: 2017-11-15 | Disposition: A | Discharge status: Home / Self Care | Payer: Self-pay | Attending: Emergency Medicine | Admitting: Emergency Medicine

## 2017-11-15 ENCOUNTER — Emergency Department: Payer: Self-pay

## 2017-11-15 ENCOUNTER — Encounter: Payer: Self-pay | Admitting: Emergency Medicine

## 2017-11-15 DIAGNOSIS — G4484 Primary exertional headache: Secondary | ICD-10-CM | POA: Insufficient documentation

## 2017-11-15 DIAGNOSIS — Z79899 Other long term (current) drug therapy: Secondary | ICD-10-CM | POA: Insufficient documentation

## 2017-11-15 MED ORDER — SODIUM CHLORIDE 0.9 % IV BOLUS (SEPSIS)
1000.0000 mL | Freq: Once | INTRAVENOUS | Status: AC
  Administered 2017-11-15: 1000 mL via INTRAVENOUS

## 2017-11-15 MED ORDER — PROCHLORPERAZINE MALEATE 10 MG PO TABS: 10.0000 mg | ORAL_TABLET | Freq: Three times a day (TID) | ORAL | 0 refills | Status: AC | PRN

## 2017-11-15 MED ORDER — PROCHLORPERAZINE EDISYLATE 5 MG/ML IJ SOLN
10.0000 mg | Freq: Once | INTRAMUSCULAR | Status: AC
  Administered 2017-11-15: 10 mg via INTRAVENOUS
  Filled 2017-11-15: qty 2

## 2017-11-15 MED ORDER — IOPAMIDOL (ISOVUE-370) INJECTION 76%
75.0000 mL | Freq: Once | INTRAVENOUS | Status: AC | PRN
  Administered 2017-11-15: 75 mL via INTRAVENOUS
  Filled 2017-11-15: qty 75

## 2017-11-15 NOTE — Discharge Instructions (Signed)
Please seek medical attention for any high fevers, chest pain, shortness of breath, change in behavior, persistent vomiting, bloody stool or any other new or concerning symptoms.  

## 2017-11-15 NOTE — ED Provider Notes (Signed)
[facility] Emergency Department Provider Note   ____________________________________________   I have reviewed the triage vital signs and the nursing notes.   HISTORY  Chief Complaint Headache   History limited by: Not Limited   HPI Jose Arnold is a 37 y.o. male who presents to the emergency department today because of concern for headache. The patient states that the headaches started roughly 10 days ago when he was working out. Started in the base of his head and radiated up into the rest of his head. The pain would be significant and sharp at outset but then get better and move to a more dull pain. It has caused his eyes to become tired. The patient has had the headache every day sense. Has noticed it every time he has tried lifting. Has not had any nausea or vomiting. Denies history of headaches.    Per medical record review patient has a history of kidney stones.  Past Medical History:  Diagnosis Date  . Kidney stones     There are no active problems to display for this patient.   Past Surgical History:  Procedure Laterality Date  . LITHOTRIPSY Right     Prior to Admission medications   Medication Sig Start Date End Date Taking? Authorizing Provider  ondansetron (ZOFRAN) 4 MG tablet Take 1 tablet (4 mg total) by mouth daily as needed for nausea or vomiting. 12/11/15   Emily FilbertWilliams, Jonathan E, MD  tamsulosin [facility](FLOMAX) 0.4 MG CAPS capsule Take 1 capsule (0.4 mg total) by mouth daily after breakfast. 12/11/15   Emily FilbertWilliams, Jonathan E, MD  triamcinolone ointment (KENALOG) 0.5 % Apply 1 application topically 2 (two) times daily. 06/25/17   Rebecka ApleyWebster, Allison P, MD    Allergies Patient has no known allergies.  No family history on file.  Social History Social History   Tobacco Use  . Smoking status: Never Smoker  . Smokeless tobacco: Never Used  Substance Use Topics  . Alcohol use: No  . Drug use: Yes    Types: Marijuana    Review of  Systems Constitutional: No fever/chills Eyes: No visual changes. ENT: No sore throat. Cardiovascular: Denies chest pain. Respiratory: Denies shortness of breath. Gastrointestinal: No abdominal pain.  No nausea, no vomiting.  No diarrhea.   Genitourinary: Negative for dysuria. Musculoskeletal: Negative for back pain. Skin: Negative for rash. Neurological: Positive for headache.   ____________________________________________   PHYSICAL EXAM:  VITAL SIGNS: ED Triage Vitals  Enc Vitals Group     BP 11/15/17 2048 122/69     Pulse Rate 11/15/17 2048 75     Resp 11/15/17 2048 15     Temp 11/15/17 2048 (!) 97.5 F (36.4 C)     Temp Source 11/15/17 2048 Oral     SpO2 11/15/17 2048 97 %     Weight 11/15/17 2049 200 lb (90.7 kg)     Height 11/15/17 2049 6\' 2"  (1.88 m)     Head Circumference --      Peak Flow --      Pain Score 11/15/17 2049 6     Pain Loc --      Pain Edu? --      Excl. in GC? --      Constitutional: Alert and oriented. Well appearing and in no distress. Eyes: Conjunctivae are normal.  ENT   Head: Normocephalic and atraumatic.   Nose: No congestion/rhinnorhea.   Mouth/Throat: Mucous membranes are moist.   Neck: No stridor. Hematological/Lymphatic/Immunilogical: No cervical lymphadenopathy. Cardiovascular:  Normal rate, regular rhythm.  No murmurs, rubs, or gallops. Respiratory: Normal respiratory effort without tachypnea nor retractions. Breath sounds are clear and equal bilaterally. No wheezes/rales/rhonchi. Gastrointestinal: Soft and non tender. No rebound. No guarding.  Genitourinary: Deferred Musculoskeletal: Normal range of motion in all extremities. No lower extremity edema. Neurologic:  Normal speech and language. No gross focal neurologic deficits are appreciated.  Skin:  Skin is warm, dry and intact. No rash noted. Psychiatric: Mood and affect are normal. Speech and behavior are normal. Patient exhibits appropriate insight and  judgment.  ____________________________________________    LABS (pertinent positives/negatives)  None  ____________________________________________   EKG  None  ____________________________________________    RADIOLOGY  CT angio No acute findings ____________________________________________   PROCEDURES  Procedures  ____________________________________________   INITIAL IMPRESSION / ASSESSMENT AND PLAN / ED COURSE  Pertinent labs & imaging results that were available during my care of the patient were reviewed by me and considered in my medical decision making (see chart for details).  Patient presented to the emergency department today because of concern for headache. The patient underwent CTA head given concern for headache with exertion. CTA negative. Patient did feel better after medication. Will give patient follow up with PCP and neurology.    ____________________________________________   FINAL CLINICAL IMPRESSION(S) / ED DIAGNOSES  Final diagnoses:  Exertional headache     Note: This dictation was prepared with Dragon dictation. Any transcriptional errors that result from this process are unintentional     Phineas Semen, MD 11/16/17 1506

## 2017-11-15 NOTE — ED Notes (Signed)
Pt reports that the back of his head and into his neck is in pain and that it occurs any time he works out or does anything strenuous.  Pt states that this has been going on for 10 days.  Pt reports that his eyes are also sore.  Pt reports that he has been taking ibuprofen and excedrin migraine every 12 hours.  Pt reports that the pain is intermittent, but is exacerbated by working out.  Pt is A&Ox4, in NAD, neurologically intact.

## 2017-11-15 NOTE — ED Triage Notes (Signed)
Pt comes into the ED via POV c/o headache.  Patient states that when he worked out on Friday it started neck paint hat is radiating into his head and causing the headache.  Patient denies h/o tension headaches or migraines that he is aware of.  Patient is neurologically intact at this time and in NAD.

## 2018-06-17 LAB — HM HIV SCREENING LAB: HM HIV SCREENING: NEGATIVE

## 2019-04-12 ENCOUNTER — Ambulatory Visit: Payer: Self-pay | Admitting: Physician Assistant

## 2019-04-12 ENCOUNTER — Encounter: Payer: Self-pay | Admitting: Physician Assistant

## 2019-04-12 ENCOUNTER — Other Ambulatory Visit: Payer: Self-pay

## 2019-04-12 DIAGNOSIS — Z113 Encounter for screening for infections with a predominantly sexual mode of transmission: Secondary | ICD-10-CM

## 2019-04-12 DIAGNOSIS — N341 Nonspecific urethritis: Secondary | ICD-10-CM

## 2019-04-12 LAB — GRAM STAIN

## 2019-04-12 MED ORDER — METRONIDAZOLE 500 MG PO TABS: 500.0000 mg | ORAL_TABLET | Freq: Once | ORAL | 0 refills | Status: AC

## 2019-04-12 MED ORDER — AZITHROMYCIN 500 MG PO TABS
1000.0000 mg | ORAL_TABLET | Freq: Once | ORAL | Status: AC
  Administered 2019-04-12: 1000 mg via ORAL

## 2019-04-12 NOTE — Progress Notes (Signed)
    STI clinic/screening visit  Subjective:  Jose Arnold is a 38 y.o. male being seen today for an STI screening visit. The patient reports they do have symptoms.  Patient has the following medical conditions:  There are no active problems to display for this patient.    Chief Complaint  Patient presents with  . SEXUALLY TRANSMITTED DISEASE    HPI  Patient reports that he has been having clear penile discharge and dysuria that has progressively worsened for about 10 days.  Denies other symptoms.  See flowsheet for further details and programmatic requirements.    The following portions of the patient's history were reviewed and updated as appropriate: allergies, current medications, past medical history, past social history, past surgical history and problem list.  Objective:  There were no vitals filed for this visit.  Physical Exam Constitutional:      General: He is not in acute distress.    Appearance: Normal appearance.  HENT:     Head: Normocephalic and atraumatic.     Mouth/Throat:     Mouth: Mucous membranes are moist.     Pharynx: Oropharynx is clear. No oropharyngeal exudate or posterior oropharyngeal erythema.  Neck:     Musculoskeletal: Neck supple.  Pulmonary:     Effort: Pulmonary effort is normal.  Abdominal:     Palpations: Abdomen is soft. There is no mass.     Tenderness: There is no abdominal tenderness. There is no guarding or rebound.  Genitourinary:    Penis: Normal.      Scrotum/Testes: Normal.     Comments: Pubic area without nits, lice, edema, erythema, lesions and inguinal adenopathy. Penis with moderate amount of cloudy discharge at meatus. No lesions. Lymphadenopathy:     Cervical: No cervical adenopathy.  Skin:    General: Skin is warm and dry.     Findings: No bruising, erythema, lesion or rash.  Neurological:     Mental Status: He is alert and oriented to person, place, and time.  Psychiatric:        Mood and Affect: Mood  normal.        Behavior: Behavior normal.        Judgment: Judgment normal.       Assessment and Plan:  Jose Arnold is a 38 y.o. male presenting to the [facility] Department for STI screening  1. Screening for STD (sexually transmitted disease) Patient with symptoms today.  Declines blood work today.  Reviewed Gram stain and will treat for NGU. Rec condoms with all sex Await test results.  Counseled that RN will call if needs to RTC for further treatment once results are back. - Gram stain - Gonococcus culture - Gonococcus culture  2. Nongonococcal urethritis Will treat for NGU with Azithromycin 1g po DOT today Will add Metronidazole 500mg   #4 po stat with food, no EtOH for 24 hr before and until 72 hr after completing medicine to cover for more of the causes of NGU.  Patient counseled that he can wait until tomorrow to take Metronidazole to decrease the chance of nausea and vomiting.   - azithromycin (ZITHROMAX) tablet 1,000 mg - metroNIDAZOLE (FLAGYL) 500 MG tablet;  Take 4 tablets by mouth once for 1 dose  Dispense: 4 tablet; Refill: 0     No follow-ups on file.  No future appointments.  Jerene Dilling, PA

## 2019-04-17 LAB — GONOCOCCUS CULTURE

## 2019-04-22 ENCOUNTER — Other Ambulatory Visit: Payer: Self-pay

## 2019-04-22 ENCOUNTER — Ambulatory Visit: Payer: Self-pay | Admitting: Physician Assistant

## 2019-04-22 DIAGNOSIS — Z113 Encounter for screening for infections with a predominantly sexual mode of transmission: Secondary | ICD-10-CM

## 2019-04-22 NOTE — Progress Notes (Signed)
Here today for STD screening. Accepts bloodwork. Shyquan Stallbaumer, RN ° °

## 2019-04-23 ENCOUNTER — Encounter: Payer: Self-pay | Admitting: Physician Assistant

## 2019-04-23 NOTE — Progress Notes (Signed)
S:  Patient RTC today requesting blood work.   States that he completed the medications given at his visit last week and that his symptoms have resolved.  Reports that after thinking about it he decided that it would be a good idea to have the blood drawn for HIV and Syphilis, too. O:  WDWN male in NAD, A&O x 3; sent to lab with paperwork for blood draw for HIV and Syphilis. A/P:  1.  Patient RTC for blood work today. 2.  Sent to lab to have blood drawn. 3.  Counseled patient that RN will call if needs to RTC once results are back. 4.  Rec condoms with all sex 5.  RTC prn.

## 2022-01-15 ENCOUNTER — Emergency Department
Admission: EM | Admit: 2022-01-15 | Discharge: 2022-01-15 | Disposition: A | Discharge status: Home / Self Care | Payer: Self-pay | Attending: Emergency Medicine | Admitting: Emergency Medicine

## 2022-01-15 ENCOUNTER — Emergency Department: Payer: Self-pay

## 2022-01-15 DIAGNOSIS — R079 Chest pain, unspecified: Secondary | ICD-10-CM

## 2022-01-15 DIAGNOSIS — R0789 Other chest pain: Secondary | ICD-10-CM | POA: Insufficient documentation

## 2022-01-15 LAB — BASIC METABOLIC PANEL
Anion gap: 8 (ref 5–15)
BUN: 20 mg/dL (ref 6–20)
CO2: 27 mmol/L (ref 22–32)
Calcium: 9.3 mg/dL (ref 8.9–10.3)
Chloride: 104 mmol/L (ref 98–111)
Creatinine, Ser: 1.25 mg/dL — ABNORMAL HIGH (ref 0.61–1.24)
GFR, Estimated: >60 mL/min (ref >60)
Glucose, Bld: 105 mg/dL — ABNORMAL HIGH (ref 70–99)
Potassium: 4.1 mmol/L (ref 3.5–5.1)
Sodium: 139 mmol/L (ref 135–145)

## 2022-01-15 LAB — CBC
HCT: 44.3 % (ref 39.0–52.0)
Hemoglobin: 14.8 g/dL (ref 13.0–17.0)
MCH: 30.5 pg (ref 26.0–34.0)
MCHC: 33.4 g/dL (ref 30.0–36.0)
MCV: 91.3 fL (ref 80.0–100.0)
Platelets: 209 10*3/uL (ref 150–400)
RBC: 4.85 MIL/uL (ref 4.22–5.81)
RDW: 13.2 % (ref 11.5–15.5)
WBC: 6.8 10*3/uL (ref 4.0–10.5)
nRBC: 0 % (ref 0.0–0.2)

## 2022-01-15 LAB — TROPONIN I (HIGH SENSITIVITY)
Troponin I (High Sensitivity): 4 ng/L (ref <18)
Troponin I (High Sensitivity): 4 ng/L (ref <18)

## 2022-01-15 LAB — D-DIMER, QUANTITATIVE: D-Dimer, Quant: 0.28 ug/mL-FEU (ref 0.00–0.50)

## 2022-01-15 MED ORDER — KETOROLAC TROMETHAMINE 15 MG/ML IJ SOLN
15.0000 mg | Freq: Once | INTRAMUSCULAR | Status: AC
  Administered 2022-01-15: 15 mg via INTRAMUSCULAR
  Filled 2022-01-15: qty 1

## 2022-01-15 NOTE — ED Provider Notes (Signed)
? ?[facility] ?Provider Note ? ? ? Event Date/Time  ? First MD Initiated Contact with Patient 01/15/22 1537   ?  (approximate) ? ? ?History  ? ?Chest Pain ? ? ?HPI ? ? ?Jose Arnold is a 41 y.o. male with no reported past medical history presents today for evaluation of chest pain.  Patient reports that his pain began this morning around 7 AM, while he was walking his dog.  He reports that the pain is stabbing and radiates through to his back.  He denies any associated nausea or diaphoresis.  He reports that he has pain with taking deep breaths.  He has not had any recent lower extremity swelling.  No recent travel, periods of immobilization, or exogenous hormone use.  No personal or family history of ACS or PE/DVT.  He also reports that his pain is worse with truncal rotation, which she noticed when reaching for his seatbelt today.  He did not take anything for his pain.  Patient does note that he had a heavy workout a couple of days ago where he did bench presses.  Denies history of smoking or drug use.  No recent fever or URI. ? ?There are no problems to display for this patient. ? ? ? ?  ? ? ?Physical Exam  ? ?Triage Vital Signs: ?ED Triage Vitals  ?Enc Vitals Group  ?   BP 01/15/22 1326 136/81  ?   Pulse Rate 01/15/22 1326 66  ?   Resp 01/15/22 1326 18  ?   Temp 01/15/22 1326 98.2 ?F (36.8 ?C)  ?   Temp Source 01/15/22 1326 Oral  ?   SpO2 01/15/22 1326 95 %  ?   Weight 01/15/22 1325 205 lb (93 kg)  ?   Height --   ?   Head Circumference --   ?   Peak Flow --   ?   Pain Score 01/15/22 1324 9  ?   Pain Loc --   ?   Pain Edu? --   ?   Excl. in GC? --   ? ? ?Most recent vital signs: ?Vitals:  ? 01/15/22 1326 01/15/22 1804  ?BP: 136/81 135/80  ?Pulse: 66 70  ?Resp: 18 17  ?Temp: 98.2 ?F (36.8 ?C) 98.8 ?F (37.1 ?C)  ?SpO2: 95% 98%  ? ? ?General: Awake, no distress. Resting comfortable ?CV:  Good peripheral perfusion.  Normal and equal pulses in all 4 extremities.  Mild anterior chest  wall tenderness, no skin changes or rash ?Resp:  Normal effort.  lungs clear to auscultation bilaterally ?Abd:  No distention. No TTP ?Other:  No lower extremity swelling, no pitting edema, no calf tenderness. ? ? ?ED Results / Procedures / Treatments  ? ?Labs ?(all labs ordered are listed, but only abnormal results are displayed) ?Labs Reviewed  ?BASIC METABOLIC PANEL - Abnormal; Notable for the following components:  ?    Result Value  ? Glucose, Bld 105 (*)   ? Creatinine, Ser 1.25 (*)   ? All other components within normal limits  ?CBC  ?D-DIMER, QUANTITATIVE  ?TROPONIN I (HIGH SENSITIVITY)  ?TROPONIN I (HIGH SENSITIVITY)  ? ? ? ?EKG ? ? ? ? ?RADIOLOGY ?I independently reviewed the x-ray, no pneumothorax, no cardiomegaly, no consolidation. ? ? ?PROCEDURES: ? ?Critical Care performed:  ? ?Procedures ? ? ?MEDICATIONS ORDERED IN ED: ?Medications  ?ketorolac (TORADOL) 15 MG/ML injection 15 mg (15 mg Intramuscular Given 01/15/22 1604)  ? ? ? ?IMPRESSION / MDM /  ASSESSMENT AND PLAN / ED COURSE  ?I reviewed the triage vital signs and the nursing notes. ? ? ?Differential diagnosis includes, but is not limited to, acute coronary syndrome, pulmonary embolism, pericarditis, myocarditis, pneumothorax, costochondritis, chest wall injury.  Patient presented to the emergency department in no acute distress.  EKG was obtained upon arrival without STEMI.  Chest x-ray demonstrates no consolidation or pneumothorax.  Troponin x2 within normal limits.  D-dimer is negative, PERC negative, do not suspect pulmonary embolism.  Heart score is 0, doubt ACS.  No abdominal pain, normal and equal pulses in all 4 extremities, and pain resolved completely after Toradol, do not suspect vascular etiology.  No recent URI, no fever to suggest myocarditis.  Reproducible chest wall tenderness and resolution of symptoms with Toradol in the setting of recent bench pressing is suggestive of chest wall pain or costochondritis.  We discussed symptomatic  management return precautions.  Patient understands and agrees with plan.  Discharged in stable condition. ? ?Clinical Course as of 01/15/22 1958  ?Wed Jan 15, 2022  ?1656 Patient reassessed, reports pain has resolved after toradol [JP]  ?  ?Clinical Course User Index ?[JP] Alpa Salvo, Herb Grays, PA-C  ? ? ? ?FINAL CLINICAL IMPRESSION(S) / ED DIAGNOSES  ? ?Final diagnoses:  ?Nonspecific chest pain  ? ? ? ?Rx / DC Orders  ? ?ED Discharge Orders   ? ? None  ? ?  ? ? ? ?Note:  This document was prepared using Dragon voice recognition software and may include unintentional dictation errors. ?  ?Jackelyn Hoehn, PA-C ?01/15/22 2002 ? ?  ?Shaune Pollack, MD ?01/20/22 1505 ? ?

## 2022-01-15 NOTE — ED Triage Notes (Signed)
Pt comes pov with left chest pain that radiates to the back starting this morning.  ?

## 2022-01-15 NOTE — Discharge Instructions (Signed)
Your blood test, EKG, and chest x-ray are normal.  Please follow-up with your outpatient provider.  Please return the emergency department immediately for any new, worsening, or change in symptoms or other concerns.  It was a pleasure caring for you today. ?

## 2022-04-22 ENCOUNTER — Emergency Department
Admission: EM | Admit: 2022-04-22 | Discharge: 2022-04-22 | Disposition: A | Discharge status: Home / Self Care | Payer: Medicaid Other | Attending: Emergency Medicine | Admitting: Emergency Medicine

## 2022-04-22 ENCOUNTER — Encounter: Payer: Self-pay | Admitting: Emergency Medicine

## 2022-04-22 DIAGNOSIS — M26609 Unspecified temporomandibular joint disorder, unspecified side: Secondary | ICD-10-CM

## 2022-04-22 DIAGNOSIS — R6884 Jaw pain: Secondary | ICD-10-CM | POA: Insufficient documentation

## 2022-04-22 MED ORDER — CYCLOBENZAPRINE HCL 10 MG PO TABS: 10.0000 mg | ORAL_TABLET | Freq: Three times a day (TID) | ORAL | 0 refills | Status: AC | PRN

## 2022-04-22 NOTE — ED Triage Notes (Signed)
Pt presents via POV with complaints of bilateral lower jaw pain for the last month. Pt endorses sensitivity to his cheeks with palpation. Airway patent, no difficulty swallowing. Denies dental pain, injury, or falls.

## 2022-04-22 NOTE — ED Provider Notes (Signed)
[facility] Provider Note    Event Date/Time   First MD Initiated Contact with Patient 04/22/22 2053     (approximate)   History   Chief Complaint Jaw Pain   HPI Jose Arnold is a 41 y.o. male, history of nephrolithiasis, presents emergency department for evaluation of bilateral lower jaw pain x 1 month.  He states that he feels like his jaw is tight and has difficulty chewing on foods due to the tightness and pain.  Denies any recent injuries or illnesses.  He is up-to-date on all childhood vaccinations, including tetanus.  He has not started any new medications.  Denies fever/chills, chest pain, shortness of breath, abdominal pain, flank pain, nausea/vomiting, diarrhea, urinary symptoms, headache, dysphagia, or dizziness/lightheadedness.  No history of poor dentition or dental fracture/infections.  History Limitations: No limitations.        Physical Exam  Triage Vital Signs: ED Triage Vitals  Enc Vitals Group     BP 04/22/22 2035 (!) 129/92     Pulse Rate 04/22/22 2035 67     Resp 04/22/22 2035 18     Temp 04/22/22 2035 98.3 F (36.8 C)     Temp Source 04/22/22 2035 Oral     SpO2 04/22/22 2035 100 %     Weight 04/22/22 2034 205 lb (93 kg)     Height 04/22/22 2034 6\' 2"  (1.88 m)     Head Circumference --      Peak Flow --      Pain Score 04/22/22 2035 7     Pain Loc --      Pain Edu? --      Excl. in GC? --     Most recent vital signs: Vitals:   04/22/22 2035  BP: (!) 129/92  Pulse: 67  Resp: 18  Temp: 98.3 F (36.8 C)  SpO2: 100%    General: Awake, NAD.  Skin: Warm, dry. No rashes or lesions.  Eyes: PERRL. Conjunctivae normal.  CV: Good peripheral perfusion.  Resp: Normal effort.  Abd: Soft, non-tender. No distention.  Neuro: At baseline. No gross neurological deficits.   Focused Exam: No gross deformities to the mandibles or surrounding erythema/tenderness.  Patient is able to open his mouth wide.  Dentition is well  cared for.  No evidence of any dental fractures or abscesses.  No signs of any dislocations.  Physical Exam    ED Results / Procedures / Treatments  Labs (all labs ordered are listed, but only abnormal results are displayed) Labs Reviewed - No data to display   EKG N/A.   RADIOLOGY  ED Provider Interpretation: N/A.  No results found.  PROCEDURES:  Critical Care performed: N/A.  Procedures    MEDICATIONS ORDERED IN ED: Medications - No data to display   IMPRESSION / MDM / ASSESSMENT AND PLAN / ED COURSE  I reviewed the triage vital signs and the nursing notes.                              Differential diagnosis includes, but is not limited to, TMJ, tetanus, dental fracture/abscess.  Assessment/Plan Presentation consistent with TMJ disorder.  He appears clinically well.  Still able to eat/drink appropriately.  Very low suspicion for tetanus given his presentation and prior vaccination history.  No evidence of any dental infections or injuries.  Will trial a course of cyclobenzaprine.  Encouraged him to take Tylenol/Ibuprofen as needed as well.  Recommend they follow-up with his primary care provider as needed if his symptoms fail to improve.  Will plan to discharge.  Provided the patient with anticipatory guidance, return precautions, and educational material. Encouraged the patient to return to the emergency department at any time if they begin to experience any new or worsening symptoms. Patient expressed understanding and agreed with the plan.   Patient's presentation is most consistent with acute complicated illness / injury requiring diagnostic workup.       FINAL CLINICAL IMPRESSION(S) / ED DIAGNOSES   Final diagnoses:  None     Rx / DC Orders   ED Discharge Orders          Ordered    cyclobenzaprine (FLEXERIL) 10 MG tablet  3 times daily PRN        04/22/22 2136             Note:  This document was prepared using Dragon voice recognition  software and may include unintentional dictation errors.   Varney Daily, [place] 04/22/22 2149    Shaune Pollack, MD 04/25/22 1010

## 2022-04-22 NOTE — Discharge Instructions (Addendum)
-  Please take the cyclobenzaprine as prescribed.  Use caution as it may make you sleepy/drowsy.  You may additionally take Tylenol/ibuprofen as needed.   -Follow-up with your primary care provider as needed if your symptoms fail to improve.  -Return to the emergency department anytime if you begin to experience any new or worsening symptoms.
